# Patient Record
Sex: Female | Born: 1980 | Race: White | Hispanic: No | Marital: Single | Smoking: Never smoker
Health system: Southern US, Community
[De-identification: ages and names within clinical notes are randomized; demographics above are authoritative.]

## PROBLEM LIST (undated history)

## (undated) DIAGNOSIS — E119 Type 2 diabetes mellitus without complications: Secondary | ICD-10-CM

## (undated) DIAGNOSIS — N159 Renal tubulo-interstitial disease, unspecified: Secondary | ICD-10-CM

## (undated) HISTORY — PX: CHOLECYSTECTOMY: SHX55

## (undated) HISTORY — DX: Type 2 diabetes mellitus without complications: E11.9

## (undated) HISTORY — DX: Renal tubulo-interstitial disease, unspecified: N15.9

---

## 2004-04-13 ENCOUNTER — Emergency Department: Payer: Self-pay | Admitting: Emergency Medicine

## 2004-06-30 ENCOUNTER — Emergency Department: Payer: Self-pay | Admitting: Emergency Medicine

## 2004-08-16 ENCOUNTER — Emergency Department: Payer: Self-pay | Admitting: Emergency Medicine

## 2004-09-04 ENCOUNTER — Emergency Department: Payer: Self-pay | Admitting: Emergency Medicine

## 2004-09-28 ENCOUNTER — Inpatient Hospital Stay: Payer: Self-pay | Admitting: Internal Medicine

## 2004-10-02 ENCOUNTER — Ambulatory Visit: Payer: Self-pay | Admitting: Internal Medicine

## 2004-12-24 ENCOUNTER — Emergency Department: Payer: Self-pay | Admitting: Emergency Medicine

## 2005-07-05 ENCOUNTER — Emergency Department: Payer: Self-pay | Admitting: General Practice

## 2005-09-27 ENCOUNTER — Emergency Department: Payer: Self-pay | Admitting: Emergency Medicine

## 2006-04-17 ENCOUNTER — Emergency Department: Payer: Self-pay | Admitting: Internal Medicine

## 2006-08-26 ENCOUNTER — Emergency Department: Payer: Self-pay

## 2006-08-28 ENCOUNTER — Emergency Department: Payer: Self-pay | Admitting: Emergency Medicine

## 2006-10-06 ENCOUNTER — Emergency Department: Payer: Self-pay | Admitting: Internal Medicine

## 2006-10-25 ENCOUNTER — Emergency Department: Payer: Self-pay | Admitting: Emergency Medicine

## 2006-10-27 ENCOUNTER — Emergency Department: Payer: Self-pay | Admitting: Unknown Physician Specialty

## 2007-03-21 ENCOUNTER — Emergency Department: Payer: Self-pay | Admitting: Internal Medicine

## 2007-03-22 ENCOUNTER — Emergency Department: Payer: Self-pay | Admitting: Emergency Medicine

## 2009-11-21 ENCOUNTER — Emergency Department: Payer: Self-pay | Admitting: Emergency Medicine

## 2009-11-28 ENCOUNTER — Ambulatory Visit: Payer: Self-pay | Admitting: Surgery

## 2009-12-02 LAB — PATHOLOGY REPORT

## 2010-04-23 ENCOUNTER — Emergency Department: Payer: Self-pay | Admitting: Internal Medicine

## 2010-07-21 ENCOUNTER — Emergency Department: Payer: Self-pay | Admitting: Emergency Medicine

## 2011-07-21 ENCOUNTER — Emergency Department: Payer: Self-pay | Admitting: Unknown Physician Specialty

## 2011-07-22 LAB — URINALYSIS, COMPLETE
Bilirubin,UR: NEGATIVE
Glucose,UR: >=500 mg/dL (ref 0–75)
Ph: 5 (ref 4.5–8.0)
Protein: NEGATIVE
RBC,UR: 15 /HPF (ref 0–5)
Specific Gravity: 1.032 (ref 1.003–1.030)
Squamous Epithelial: 2
WBC UR: 235 /HPF (ref 0–5)

## 2011-09-27 ENCOUNTER — Emergency Department: Payer: Self-pay | Admitting: Emergency Medicine

## 2011-09-27 LAB — URINALYSIS, COMPLETE
Bilirubin,UR: NEGATIVE
Glucose,UR: NEGATIVE mg/dL (ref 0–75)
Ketone: NEGATIVE
Nitrite: NEGATIVE
Ph: 5 (ref 4.5–8.0)
RBC,UR: 11 /HPF (ref 0–5)
Specific Gravity: 1.024 (ref 1.003–1.030)

## 2011-09-27 LAB — CBC
HCT: 41.5 % (ref 35.0–47.0)
MCH: 28.8 pg (ref 26.0–34.0)
MCHC: 33.9 g/dL (ref 32.0–36.0)
Platelet: 280 10*3/uL (ref 150–440)
RDW: 12.9 % (ref 11.5–14.5)
WBC: 10.8 10*3/uL (ref 3.6–11.0)

## 2011-09-27 LAB — COMPREHENSIVE METABOLIC PANEL
Alkaline Phosphatase: 89 U/L (ref 50–136)
BUN: 15 mg/dL (ref 7–18)
Bilirubin,Total: 0.8 mg/dL (ref 0.2–1.0)
Chloride: 102 mmol/L (ref 98–107)
EGFR (African American): >60
Glucose: 172 mg/dL — ABNORMAL HIGH (ref 65–99)
SGOT(AST): 31 U/L (ref 15–37)
SGPT (ALT): 39 U/L
Total Protein: 9.1 g/dL — ABNORMAL HIGH (ref 6.4–8.2)

## 2011-09-27 LAB — LIPASE, BLOOD: Lipase: 70 U/L — ABNORMAL LOW (ref 73–393)

## 2012-02-22 ENCOUNTER — Emergency Department: Payer: Self-pay | Admitting: Emergency Medicine

## 2013-03-01 ENCOUNTER — Emergency Department: Payer: Self-pay | Admitting: Emergency Medicine

## 2013-05-14 ENCOUNTER — Emergency Department: Payer: Self-pay | Admitting: Emergency Medicine

## 2013-05-14 LAB — CBC WITH DIFFERENTIAL/PLATELET
BASOS PCT: 0.3 %
Basophil #: 0 10*3/uL (ref 0.0–0.1)
EOS ABS: 0.1 10*3/uL (ref 0.0–0.7)
Eosinophil %: 1.4 %
HCT: 40 % (ref 35.0–47.0)
HGB: 13.4 g/dL (ref 12.0–16.0)
Lymphocyte #: 3.8 10*3/uL — ABNORMAL HIGH (ref 1.0–3.6)
Lymphocyte %: 40.6 %
MCH: 27.5 pg (ref 26.0–34.0)
MCHC: 33.4 g/dL (ref 32.0–36.0)
MCV: 83 fL (ref 80–100)
Monocyte #: 0.4 x10 3/mm (ref 0.2–0.9)
Monocyte %: 4.2 %
NEUTROS ABS: 5.1 10*3/uL (ref 1.4–6.5)
Neutrophil %: 53.5 %
Platelet: 259 10*3/uL (ref 150–440)
RBC: 4.85 10*6/uL (ref 3.80–5.20)
RDW: 13.7 % (ref 11.5–14.5)
WBC: 9.4 10*3/uL (ref 3.6–11.0)

## 2013-05-14 LAB — COMPREHENSIVE METABOLIC PANEL
ANION GAP: 4 — AB (ref 7–16)
Albumin: 3.5 g/dL (ref 3.4–5.0)
Alkaline Phosphatase: 109 U/L
BUN: 7 mg/dL (ref 7–18)
Bilirubin,Total: 1 mg/dL (ref 0.2–1.0)
Calcium, Total: 9.1 mg/dL (ref 8.5–10.1)
Chloride: 104 mmol/L (ref 98–107)
Co2: 27 mmol/L (ref 21–32)
Creatinine: 0.77 mg/dL (ref 0.60–1.30)
EGFR (African American): >60
Glucose: 233 mg/dL — ABNORMAL HIGH (ref 65–99)
OSMOLALITY: 276 (ref 275–301)
POTASSIUM: 3.7 mmol/L (ref 3.5–5.1)
SGOT(AST): 81 U/L — ABNORMAL HIGH (ref 15–37)
SGPT (ALT): 64 U/L (ref 12–78)
Sodium: 135 mmol/L — ABNORMAL LOW (ref 136–145)
Total Protein: 9.3 g/dL — ABNORMAL HIGH (ref 6.4–8.2)

## 2013-05-14 LAB — URINALYSIS, COMPLETE
Bacteria: NONE SEEN
Bilirubin,UR: NEGATIVE
Ketone: NEGATIVE
NITRITE: NEGATIVE
Ph: 5 (ref 4.5–8.0)
RBC,UR: 260 /HPF (ref 0–5)
Specific Gravity: 1.023 (ref 1.003–1.030)
WBC UR: 24 /HPF (ref 0–5)

## 2013-05-14 LAB — LIPASE, BLOOD: Lipase: 73 U/L (ref 73–393)

## 2013-05-14 LAB — PREGNANCY, URINE: Pregnancy Test, Urine: NEGATIVE m[IU]/mL

## 2013-08-13 ENCOUNTER — Inpatient Hospital Stay: Payer: Self-pay | Admitting: Student

## 2013-08-13 LAB — CBC WITH DIFFERENTIAL/PLATELET
BASOS ABS: 0 10*3/uL (ref 0.0–0.1)
BASOS PCT: 0.1 %
EOS PCT: 0 %
Eosinophil #: 0 10*3/uL (ref 0.0–0.7)
HCT: 38.9 % (ref 35.0–47.0)
HGB: 12.9 g/dL (ref 12.0–16.0)
LYMPHS PCT: 8.9 %
Lymphocyte #: 1.3 10*3/uL (ref 1.0–3.6)
MCH: 26.8 pg (ref 26.0–34.0)
MCHC: 33.1 g/dL (ref 32.0–36.0)
MCV: 81 fL (ref 80–100)
MONO ABS: 0.7 x10 3/mm (ref 0.2–0.9)
Monocyte %: 4.9 %
NEUTROS ABS: 12.3 10*3/uL — AB (ref 1.4–6.5)
Neutrophil %: 86.1 %
Platelet: 180 10*3/uL (ref 150–440)
RBC: 4.79 10*6/uL (ref 3.80–5.20)
RDW: 14.8 % — ABNORMAL HIGH (ref 11.5–14.5)
WBC: 14.2 10*3/uL — ABNORMAL HIGH (ref 3.6–11.0)

## 2013-08-13 LAB — COMPREHENSIVE METABOLIC PANEL
ALK PHOS: 91 U/L
ALT: 40 U/L (ref 12–78)
AST: 32 U/L (ref 15–37)
Albumin: 3.2 g/dL — ABNORMAL LOW (ref 3.4–5.0)
Anion Gap: 8 (ref 7–16)
BILIRUBIN TOTAL: 1.1 mg/dL — AB (ref 0.2–1.0)
BUN: 7 mg/dL (ref 7–18)
Calcium, Total: 9 mg/dL (ref 8.5–10.1)
Chloride: 102 mmol/L (ref 98–107)
Co2: 24 mmol/L (ref 21–32)
Creatinine: 0.77 mg/dL (ref 0.60–1.30)
EGFR (Non-African Amer.): >60
Glucose: 197 mg/dL — ABNORMAL HIGH (ref 65–99)
Osmolality: 272 (ref 275–301)
POTASSIUM: 3.8 mmol/L (ref 3.5–5.1)
SODIUM: 134 mmol/L — AB (ref 136–145)
TOTAL PROTEIN: 8.9 g/dL — AB (ref 6.4–8.2)

## 2013-08-13 LAB — URINALYSIS, COMPLETE
BACTERIA: NONE SEEN
Bilirubin,UR: NEGATIVE
Glucose,UR: NEGATIVE mg/dL (ref 0–75)
Nitrite: POSITIVE
PH: 6 (ref 4.5–8.0)
Protein: 30
RBC,UR: NONE SEEN /HPF (ref 0–5)
Specific Gravity: 1.016 (ref 1.003–1.030)
WBC UR: 113 /HPF (ref 0–5)

## 2013-08-15 LAB — CREATININE, SERUM: CREATININE: 0.54 mg/dL — AB (ref 0.60–1.30)

## 2013-08-15 LAB — URINE CULTURE

## 2013-08-15 LAB — VANCOMYCIN, TROUGH: Vancomycin, Trough: 6 ug/mL — ABNORMAL LOW (ref 10–20)

## 2013-08-16 LAB — CBC WITH DIFFERENTIAL/PLATELET
BASOS PCT: 0.2 %
Basophil #: 0 10*3/uL (ref 0.0–0.1)
EOS ABS: 0.1 10*3/uL (ref 0.0–0.7)
Eosinophil %: 1.4 %
HCT: 33.4 % — ABNORMAL LOW (ref 35.0–47.0)
HGB: 11 g/dL — ABNORMAL LOW (ref 12.0–16.0)
LYMPHS PCT: 34.8 %
Lymphocyte #: 2.7 10*3/uL (ref 1.0–3.6)
MCH: 26.9 pg (ref 26.0–34.0)
MCHC: 32.9 g/dL (ref 32.0–36.0)
MCV: 82 fL (ref 80–100)
Monocyte #: 0.5 x10 3/mm (ref 0.2–0.9)
Monocyte %: 6 %
NEUTROS PCT: 57.6 %
Neutrophil #: 4.5 10*3/uL (ref 1.4–6.5)
Platelet: 184 10*3/uL (ref 150–440)
RBC: 4.09 10*6/uL (ref 3.80–5.20)
RDW: 14.4 % (ref 11.5–14.5)
WBC: 7.8 10*3/uL (ref 3.6–11.0)

## 2013-08-16 LAB — HEMOGLOBIN A1C: Hemoglobin A1C: 10.2 % — ABNORMAL HIGH (ref 4.2–6.3)

## 2013-08-16 LAB — BASIC METABOLIC PANEL
Anion Gap: 3 — ABNORMAL LOW (ref 7–16)
BUN: 5 mg/dL — ABNORMAL LOW (ref 7–18)
CO2: 26 mmol/L (ref 21–32)
Calcium, Total: 8.3 mg/dL — ABNORMAL LOW (ref 8.5–10.1)
Chloride: 107 mmol/L (ref 98–107)
Creatinine: 0.61 mg/dL (ref 0.60–1.30)
GLUCOSE: 188 mg/dL — AB (ref 65–99)
Osmolality: 274 (ref 275–301)
Potassium: 3.6 mmol/L (ref 3.5–5.1)
Sodium: 136 mmol/L (ref 136–145)

## 2013-08-17 LAB — WOUND AEROBIC CULTURE

## 2013-08-17 LAB — CLOSTRIDIUM DIFFICILE(ARMC)

## 2013-08-18 LAB — CULTURE, BLOOD (SINGLE)

## 2013-08-19 LAB — CULTURE, BLOOD (SINGLE)

## 2013-10-23 ENCOUNTER — Emergency Department: Payer: Self-pay | Admitting: Emergency Medicine

## 2013-10-24 LAB — CBC
HCT: 37.3 % (ref 35.0–47.0)
HGB: 12.6 g/dL (ref 12.0–16.0)
MCH: 27.9 pg (ref 26.0–34.0)
MCHC: 33.9 g/dL (ref 32.0–36.0)
MCV: 82 fL (ref 80–100)
Platelet: 269 10*3/uL (ref 150–440)
RBC: 4.53 10*6/uL (ref 3.80–5.20)
RDW: 14.1 % (ref 11.5–14.5)
WBC: 11 10*3/uL (ref 3.6–11.0)

## 2013-11-27 DIAGNOSIS — E119 Type 2 diabetes mellitus without complications: Secondary | ICD-10-CM | POA: Insufficient documentation

## 2014-03-06 IMAGING — CR LEFT MIDDLE FINGER 2+V
1 series · 3 of 3 positions shown · non-contrast
Comparison: none

REASON FOR EXAM: finger infection eval deep space infection or
osteomyelitis
COMMENTS:

[Series 1: x finger pa left · 0.14mm/px · 3 of 3 slices shown]
[im 1/3]
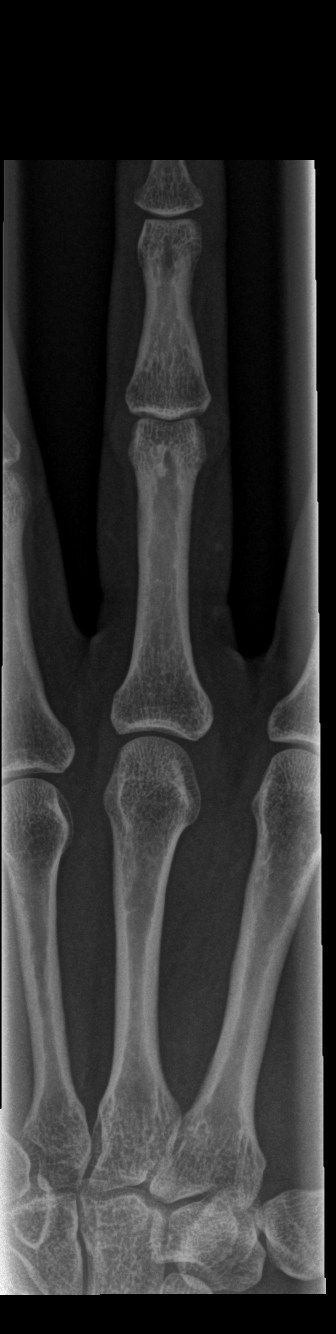
[im 2/3]
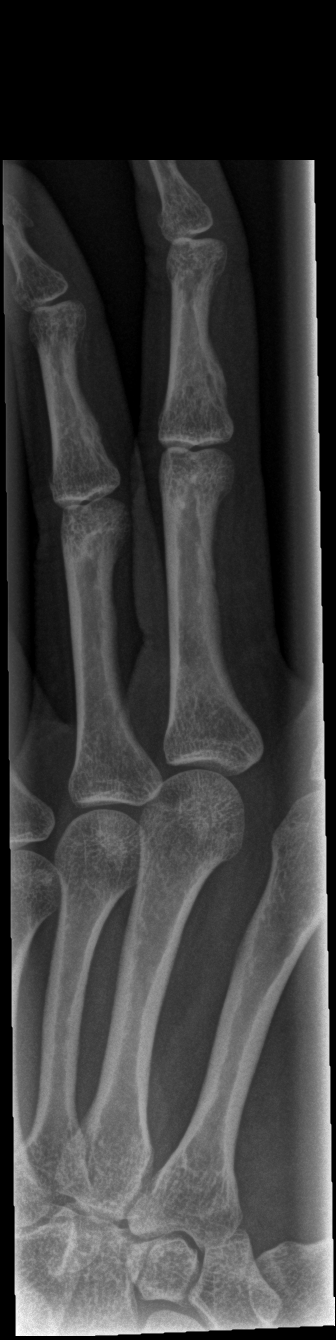
[im 3/3]
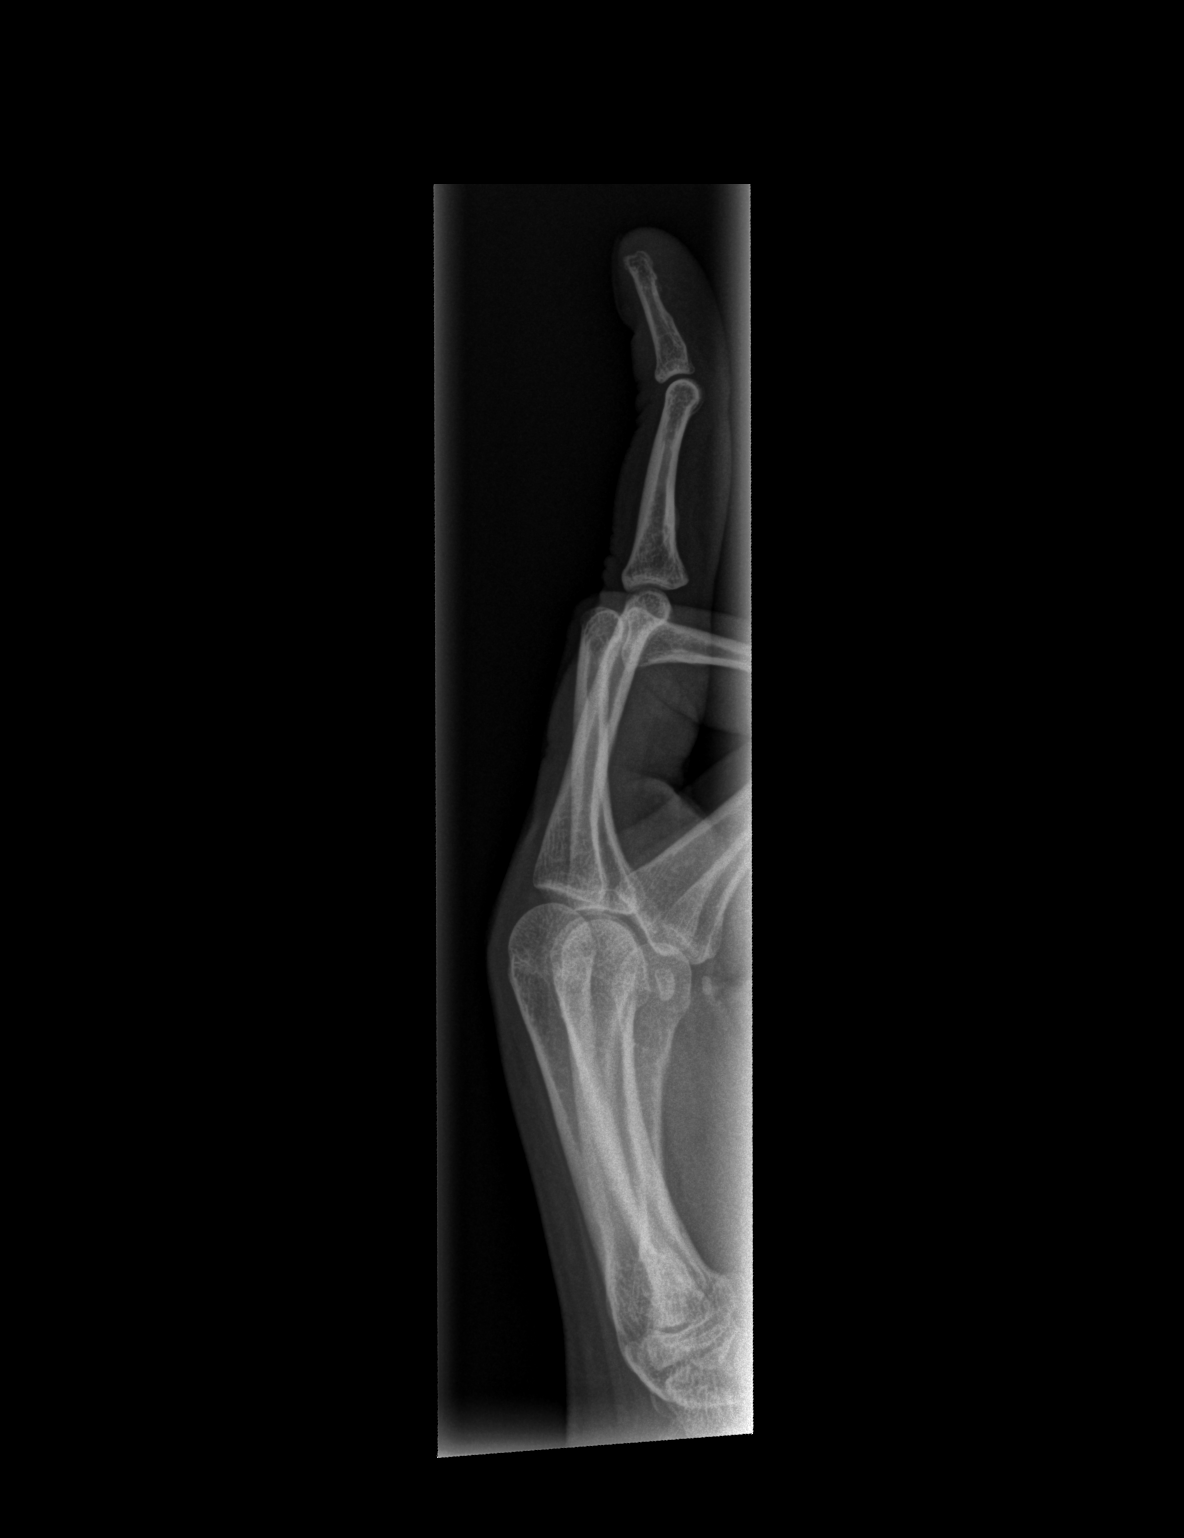

[3 of 3 positions shown; findings below may reference images not displayed]

PROCEDURE:     DXR - DXR FINGER MID 3RD DIGIT LT HAND  - February 22, 2012  [DATE]

RESULT:     Three views of the third or long finger reveal the bones to be
adequately mineralized. The site of the patient's infection is not known to
me. I do not see gas in the soft tissues. The joint spaces are preserved.
There is no periosteal reaction or lytic or blastic lesion.
IMPRESSION: No bony abnormality of the finger is demonstrated. No soft
tissue gas is evident.

[REDACTED]

## 2014-03-26 LAB — HEPATIC FUNCTION PANEL
ALT: 41 U/L — AB (ref 7–35)
AST: 35 U/L (ref 13–35)
Alkaline Phosphatase: 71 U/L (ref 25–125)
BILIRUBIN DIRECT: 0.16 mg/dL (ref 0.01–0.4)
BILIRUBIN, TOTAL: 0.6 mg/dL

## 2014-03-26 LAB — HEMOGLOBIN A1C: HEMOGLOBIN A1C: 7.6

## 2014-03-26 LAB — BASIC METABOLIC PANEL
BUN: 10 mg/dL (ref 4–21)
Creatinine: 0.5 mg/dL (ref 0.5–1.1)
Glucose: 130 mg/dL
POTASSIUM: 4.8 mmol/L (ref 3.4–5.3)
SODIUM: 138 mmol/L (ref 137–147)

## 2014-03-26 LAB — TSH: TSH: 1.99 u[IU]/mL (ref 0.41–5.90)

## 2014-03-26 LAB — CBC AND DIFFERENTIAL
HEMATOCRIT: 36 % (ref 36–46)
HEMOGLOBIN: 12.6 g/dL (ref 12.0–16.0)
Neutrophils Absolute: 6 /uL
Platelets: 285 10*3/uL (ref 150–399)
WBC: 10.2 10^3/mL

## 2014-03-26 LAB — LIPID PANEL
CHOLESTEROL: 183 mg/dL (ref 0–200)
HDL: 26 mg/dL — AB (ref 35–70)
Triglycerides: 402 mg/dL — AB (ref 40–160)

## 2014-07-13 NOTE — Consult Note (Signed)
PATIENT NAME:  Felicia DykesCORLISS, Katora L MR#:  [id number] DATE OF BIRTH:  04/20/1980  DATE OF CONSULTATION:  08/15/2013  REFERRING PHYSICIAN:   CONSULTING PHYSICIAN:  Rhona RaiderMatthew G. Denika Krone, DPM  The patient was admitted on May 25th with a combination of UTI and cellulitis infection into the right great toe. She is a type 1 diabetic with history of poor control. She was admitted to the ER with temperature of 101.2 and elevated white count. She has been on vancomycin and ceftriaxone since 2 days ago. Cultures have shown strep and staph growth, but no sensitivities as of yet.   SOCIAL HISTORY: Includes occasional EtOH. Denies recreational drug or tobacco use.   FAMILY HISTORY: Includes diabetes and COPD.  HOME MEDICATIONS:  Include Glucophage 500 mg by mouth twice a day.   White count on May 25th was 14.2 and serum glucose was 197. BUN and creatinine were within normal limits.   PHYSICAL EXAMINATION:  VITAL SIGNS: Currently, temperature 98.4, pulse 92, respirations 18, blood pressure 111/76, pulse ox percentage is 99%.  LOWER EXTREMITIES: Vascular DP pulses are +2/4 bilaterally.  NEUROLOGIC: The patient has extensive neuropathy to both lower extremities with minimal feeling to either foot.  DERMATOLOGIC: The patient has significant hyperkeratotic buildup on the IPJ of the right hallux plantarly. There are indications that this is also a neuropathic ulceration with cellulitis and lymphangitis extending from this area. Debridement of the area today shows an ulcerative change plantarly and medially on that IP joints. Total size is about 2 cm x 1.5 cm. It does not appear to have a deeper penetration down to bone at this point, or any sinus tracking.   X-rays reviewed. No specific evidence of osteomyelitis or bone demineralization at that region at this timeframe.   CLINICAL IMPRESSION:  Diabetic ulceration with cellulitis, right great toe.   TREATMENT PLAN:  I am going to do an excisional debridement with a  15 blade to remove all thickened hyperkeratotic and devitalized tissue from the wound. This included the periphery, as well as centrally. This was followed with a wet-to-dry saline dressing, and I will move her into an OrthoWedge shoe with daily dressing changes and Bactroban, and we will continue to follow her for the next couple of days until we get cultures back and see if we can switch her to an oral antibiotic.    ____________________________ Rhona RaiderMatthew G. Rahm Minix, DPM mgt:dmm D: 08/15/2013 18:17:59 ET T: 08/15/2013 21:12:36 ET JOB#: [id number]  cc: Rhona RaiderMatthew G. Aiman Sonn, DPM, <Dictator> Epimenio SarinMATTHEW G Raynah Gomes MD ELECTRONICALLY SIGNED 08/20/2013 15:02

## 2014-07-13 NOTE — H&P (Signed)
PATIENT NAME:  Felicia Brewer, Felicia Brewer MR#:  [id number] DATE OF BIRTH:  10-Nov-1980  DATE OF ADMISSION:  08/13/2013  REFERRING PHYSICIAN: Dr. Margarita GrizzleWoodruff.   PRIMARY CARE PHYSICIAN: None.   CHIEF COMPLAINT: Not feeling well.   HISTORY OF PRESENT ILLNESS: A 34 year old Caucasian female with past medical history of diabetes, presenting with generalized malaise. Describes one day duration of fevers, chills, nausea, vomiting, nonbloody, nonbilious emesis with associated headache. Describes headache as frontal, mainly  aching in quality, 3 to 4 out of 10 in intensity, worse with bright lights, loud noises. No relieving factors. With the above symptoms, decided to present to the hospital for further work-up and evaluation. He also took temperature at home; maximum was 101.2 degrees Fahrenheit.   REVIEW OF SYSTEMS:   CONSTITUTIONAL: Positive for fevers, chills, fatigue.  EYES: Denies blurred vision, double vision, eye pain.  EARS, NOSE, THROAT: Denies tinnitus, ear pain, hearing loss.  RESPIRATORY: Denies cough, wheeze, shortness of breath.  CARDIOVASCULAR: Denies chest pain, palpitations, edema. GASTROINTESTINAL: Denies nausea, vomiting, diarrhea or abdominal pain.  GENITOURINARY: Denies dysuria, hematuria or change in urinary frequency.  ENDOCRINE: Denies nocturia or thyroid problems.  HEMATOLOGIC AND LYMPHATIC: Denies easy bruising, bleeding.  SKIN: Denies rash or lesion.  MUSCULOSKELETAL: Denies pain in neck, back, shoulder, knees, hips or arthritic symptoms.  NEUROLOGIC: Positive for numbness in both feet bilaterally, which is chronic. Denies any weakness.  PSYCHIATRIC: Denies anxiety or depressive symptoms.  Otherwise, full review of systems performed by me is negative.   PAST MEDICAL HISTORY: Diabetes.   SOCIAL HISTORY: Occasional alcohol use. Denies any drug use or tobacco use.   FAMILY HISTORY: Positive for diabetes and chronic obstructive pulmonary disease.   ALLERGIES: No known drug  allergies.   HOME MEDICATIONS: Include Glucophage 500 mg by mouth twice daily.   PHYSICAL EXAMINATION: VITAL SIGNS: Temperature 99.2, heart rate 118, respirations 18, blood pressure 121/81, saturating 98% on room air. Weight 86.2 kg, BMI 32.6.  GENERAL: Well-nourished, well-developed, Caucasian female, appearing in no acute distress.  HEAD: Normocephalic, atraumatic.  EYES: Pupils equal, round, reactive to light. Extraocular movements intact. No scleral icterus. MOUTH: Moist mucous membranes. Dentition intact. No exudates noted. EARS, NOSE, AND THROAT: Clear, without exudates,  lesions. NECK: Supple. No thyromegaly. No nodules. No JVD.  PULMONARY: Clear to auscultation bilaterally without wheezes, rales, or rhonchi. No use of accessory muscles. Good respiratory effort. Chest nontender to palpation.  CARDIOVASCULAR: S1, S2, tachycardic. No murmurs, rubs or gallops. No edema. Pedal pulses 2+ bilaterally. GASTROINTESTINAL: Soft. Tenderness to palpation over suprapubic region, without rebound or guarding. Nondistended. No masses. Positive bowel sounds. No hepatosplenomegaly. Bilateral CVA tenderness is present.  MUSCULOSKELETAL: No swelling, clubbing, or edema. Full range of motion. Right great toe with erythema and streaking up to mid shin.  NEUROLOGIC: Cranial nerves II through XII intact. No gross focal neurologic deficits. Reflexes intact. Diminished sensation to light touch over the plantar surfaces of both feet.  SKIN: No ulcerations, lesions, rashes, or cyanosis, other than as described above over right great toe, with erythema and streaking to mid shin, which is warm to touch. Skin warm, dry. Turgor intact.  PSYCHIATRIC: Mood and affect within normal limits. The patient is awake, alert, oriented x3. Insight and judgment intact.   LABORATORY DATA: Urine pregnancy test negative. Sodium 134, potassium 3.8, chloride 102, bicarbonate 24, BUN 7, creatinine 0.77, glucose 197. LFTs: Total protein  8.9, albumin 3.2, bilirubin 1.1, alkaline phosphatase 91, AST 32, ALT 40. WBC 14.2, hemoglobin 12.9, and platelets  of 180. Urinalysis: Leukocyte esterase 3+, nitrite positive, protein 30, WBCs 113, RBCs 0, epithelial cells 5. Foot x-ray performed, revealing slight cortical irregularity without osseous erosion, not suggestive of osteomyelitis.   ASSESSMENT AND PLAN: A 34 year old Caucasian female with history of diabetes, presenting with generalized malaise.   1.  Sepsis. Meets sepsis criteria by heart rate and leukocytosis, likely urinary source, though, of note, she does have cellulitis of the right great toe. Panculture including blood, urine and wound cultures were sent from the Emergency Department after the Emergency Room physician performed an I and D which drained serous fluid of the right great toe. Antibiotic coverage with ceftriaxone and vancomycin. Will follow culture data. Decrease antibiotics when data returns. IV fluid hydration. Keep mean arterial pressure greater than 65.  2.  Diabetes. Hold oral agents. Add insulin sliding scale with every 6 hour Accu-Cheks.  3.  Hyponatremia. IV fluid hydration with normal saline. Follow sodium levels.  4.  Deep venous thrombosis prophylaxis with heparin subcutaneous.   CODE STATUS: The patient is full code.   TIME SPENT: 45 minutes.   ____________________________ Cletis Athens. Teonna Coonan, MD dkh:cg D: 08/14/2013 00:04:59 ET T: 08/14/2013 00:21:17 ET JOB#: [id number]  cc: Cletis Athens. Jezlyn Westerfield, MD, <Dictator> Harshaan Whang Synetta Shadow MD ELECTRONICALLY SIGNED 08/15/2013 3:25

## 2014-07-13 NOTE — Discharge Summary (Signed)
PATIENT NAME:  Felicia DykesCORLISS, Nyana L MR#:  [id number] DATE OF BIRTH:  18-Apr-1980  DATE OF ADMISSION:  08/13/2013 DATE OF DISCHARGE:  08/17/2013  CONSULTANTS: Dr. Orland Jarredroxler from podiatry.   PRIMARY CARE PHYSICIAN:  following at [facility].   CHIEF COMPLAINT:  Not feeling well.   DISCHARGE DIAGNOSES: 1.  Sepsis due to urinary tract infection and diabetic foot infection/abscess, status post incision and drainage by podiatry.  2.  Diabetes, uncontrolled.  3.  Hyponatremia.  4.  Diabetic neuropathy.  DISCHARGE MEDICATIONS:  Metformin 1000 mg 3 times a day, gabapentin 300 mg once a day, cephalexin 500 mg every 12 hours until 7th of June, mupirocin 2% topical ointment 1 application every 12 hours, Lantus 18 units once a day at bedtime.   DIET:  ADA diet.   ACTIVITY:  As tolerated.   Please follow with PCP at [facility] within 1 to 2 weeks. Please follow with podiatry next week.   DISPOSITION:  Home.   SIGNIFICANT LABORATORY DATA AND IMAGING: Initial BUN 7, creatinine 0.77, sodium 134, glucose of 197. Hemoglobin A1c of 10.2. Urine cultures showing E. coli. Initial white count of 14,000. Wound cultures from the foot showing heavy growth of Staph aureus and strep.  The Staph aureus was oxacillin sensitive. Blood cultures: No growth to date from May 25, as well as May 26. C. diff negative. Great toe foot x-ray showing slight cortical irregularity along the base of the first distal phalanx without definitive osseous erosions to suggest osteomyelitis.   HISTORY OF PRESENT ILLNESS AND HOSPITAL COURSE: For full details of H and P, please see the dictation on May 25 by Dr. Clint GuyHower, but briefly this is 34 year old long-time diabetic, who came in with general malaise with 1 day duration of fevers, chills, nausea  and vomiting, which was nonbloody. She came into the hospital. She had a temperature of 101.2 per H and P at home.  Here, she was tachycardic and had a heart rate of 118, as well as significant  leukocytosis. She did meet sepsis criteria per definition and therefore was admitted, pancultured and started on ceftriaxone and vancomycin. In the Emergency Room, the patient also underwent an I and D by the ER physician and drained serous fluid of the right great toe area, which appeared to have an infection in it, as well as dirty urine. She was also seen by Dr. Orland Jarredroxler on May 27, and the patient underwent a second I and D at bedside. Cultures from the urine have shown a sensitive E. coli and the wound cultures are showing a couple of organisms. At this point, she will be discharged on Keflex for the duration of her treatment of the urine and wounds are susceptible. She was followed by Dr. Orland Jarredroxler on the 28th and was notified to follow with him as an outpatient. In regards to her diabetes, her hemoglobin A1c was significantly elevated. She appears to be in poor compliance and did not know what her hemoglobin A1c was an outpatient, and did not even know what a hemoglobin A1c was. She also does not have a glucometer and does not check her sugars regularly. Here, she was given a glucometer, was discharged on long-acting insulin, in addition to her metformin, and was advised to follow with a physician for up titration of her diabetes or down titration as needed.   She did have significant diabetic neuropathy and was started on gabapentin. I suspect the neuropathy also contributed to her diabetic ulcer/abscess, in addition to  her uncontrolled diabetes. She had mild hyponatremia, which has resolved. At this point, she was discharged after C. diff stool culture was negative, as she did have some loose stools on the day of discharge.   TOTAL TIME SPENT: 35 minutes.   The patient is FULL CODE.   ____________________________ Krystal Eaton, MD sa:dmm D: 08/20/2013 12:11:07 ET T: 08/20/2013 13:19:56 ET JOB#: [id number]  cc: Krystal Eaton, MD, <Dictator> Rhona Raider. Troxler, DPM Krystal Eaton  MD ELECTRONICALLY SIGNED 08/30/2013 10:57

## 2014-07-13 NOTE — Consult Note (Signed)
Brief Consult Note: Diagnosis: diabetic ulcer right great toe with cellulitis.   Patient was seen by consultant.   Consult note dictated.   Recommend to proceed with surgery or procedure.   Orders entered.   Comments: At bedside, I performed an excisional debridement of the right toe ulcer. All devitalized and hyperkeratotic tissue was removed with a 10 blade.  I will order a wedge shoe and daily dressing changes.  Electronic Signatures: Epimenio Sarinroxler, Trapper Meech G (MD)  (Signed 27-May-15 18:21)  Authored: Brief Consult Note   Last Updated: 27-May-15 18:21 by Epimenio Sarinroxler, Therma Lasure G (MD)

## 2015-05-27 IMAGING — CR DG ABDOMEN 1V
1 series · 2 of 2 positions shown · non-contrast
Comparison: CT scan from 09/27/2011

CLINICAL DATA: Abdominal pain and constipation.

EXAM:
ABDOMEN - 1 VIEW

[Series 1: t abdomen supine · 0.14mm/px · 2 of 2 slices shown]
[im 1/2]
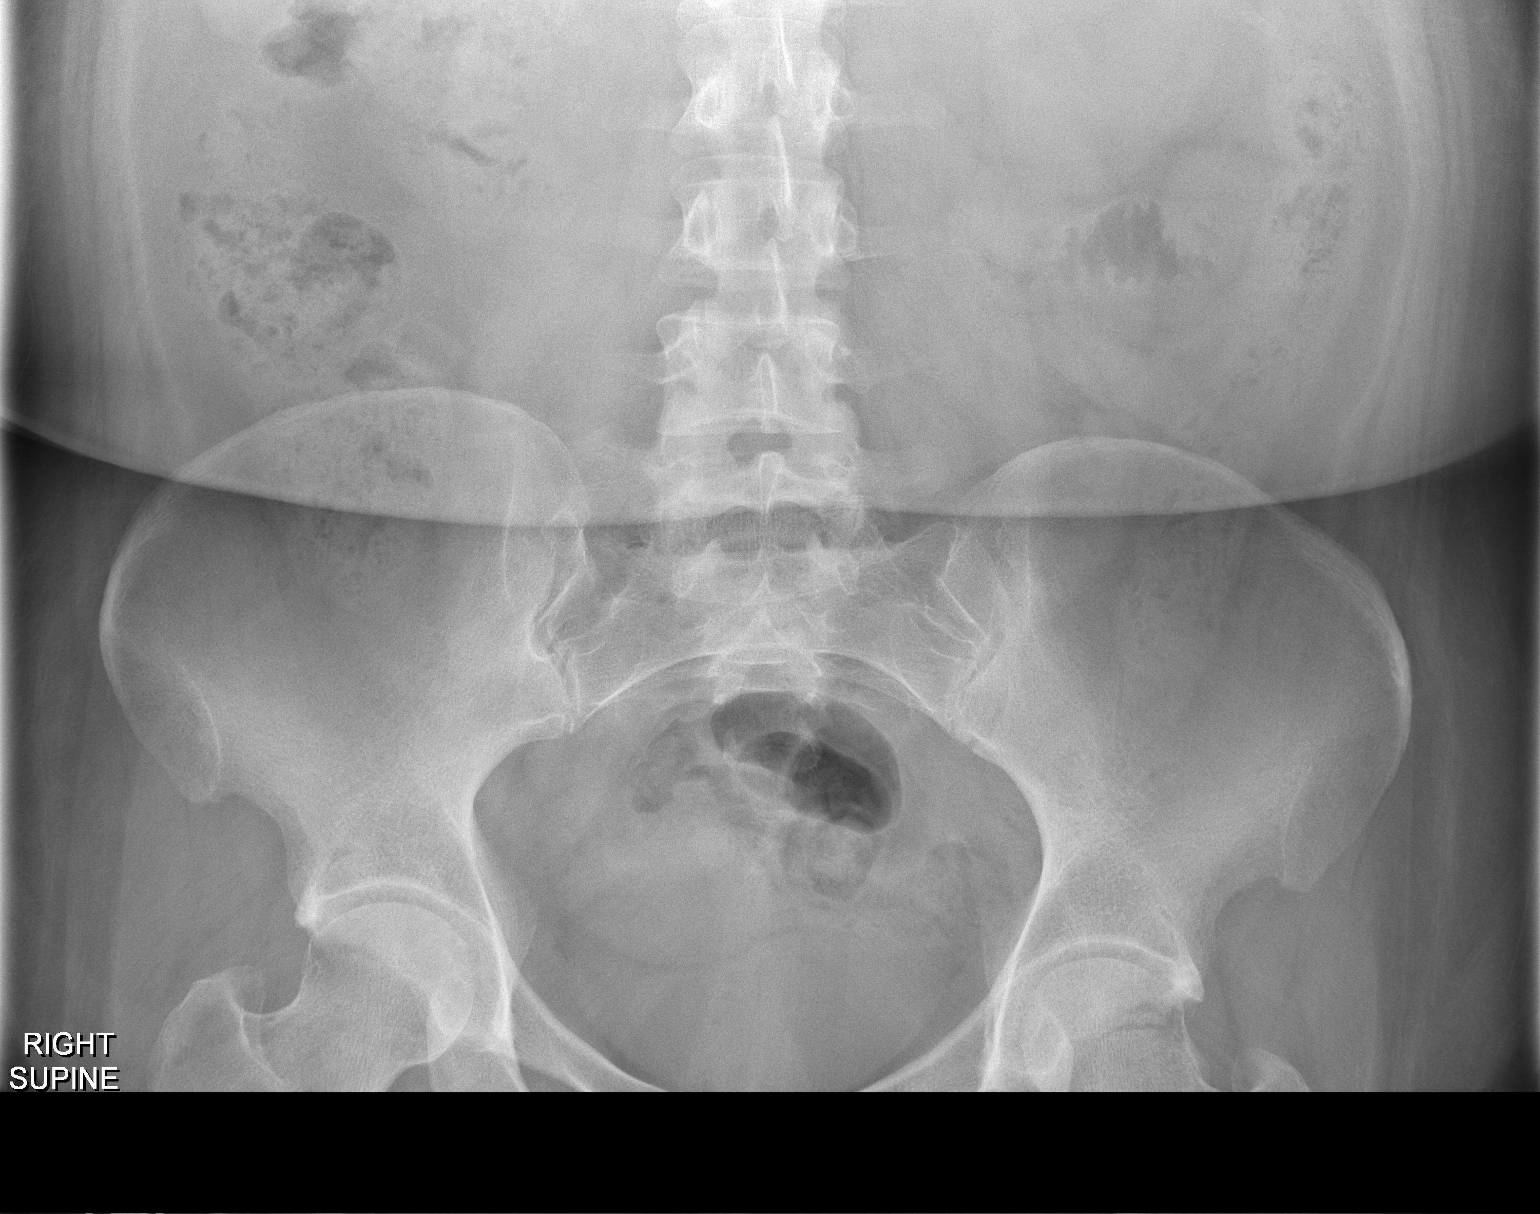
[im 2/2]
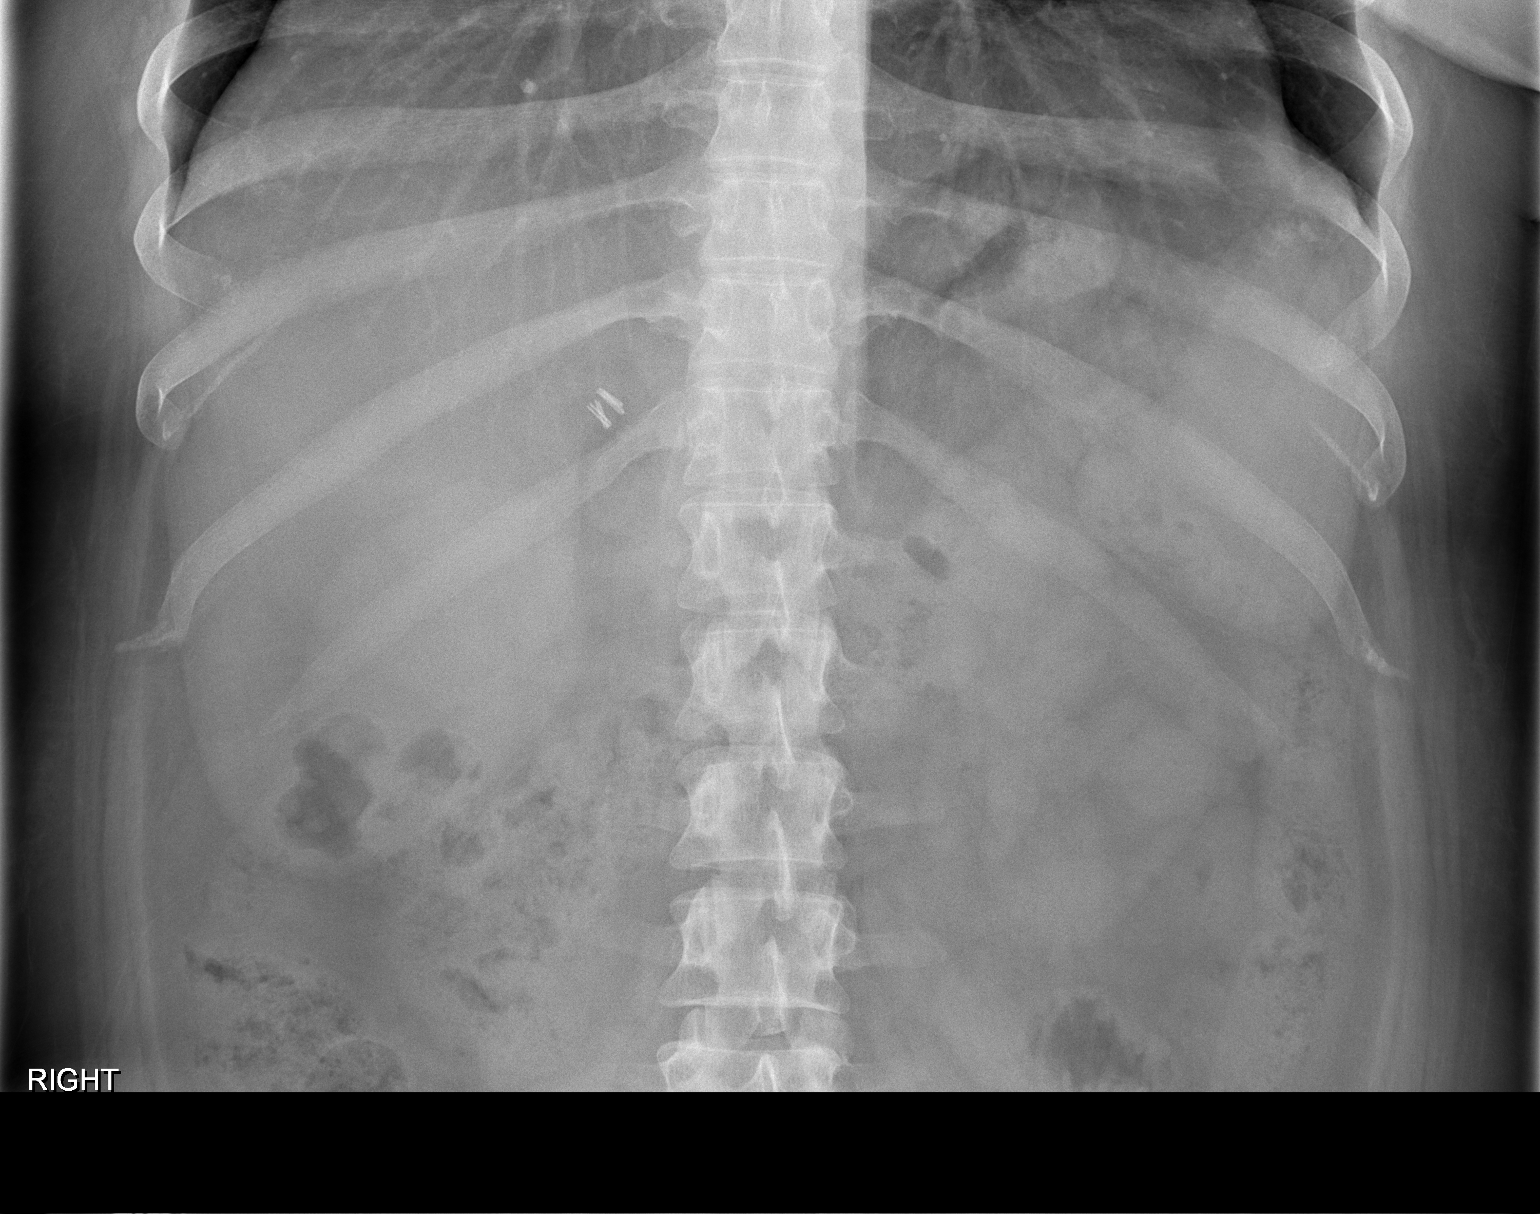

[2 of 2 positions shown; findings below may reference images not displayed]

FINDINGS: Supine view of the abdomen shows no gaseous bowel dilatation to
suggest obstruction. Surgical clips in the right upper quadrant
suggest prior cholecystectomy. Low to moderate stool volume
identified. No unexpected abdominal pelvic calcification. Mach
effect overlying the left femoral neck is secondary to the
acetabular rim.
IMPRESSION: Low to moderate stool volume. No radiographic features to suggest
clinical constipation.

## 2015-08-26 IMAGING — CR RIGHT GREAT TOE
1 series · 4 of 4 positions shown · non-contrast
Comparison: None.

CLINICAL DATA: Diabetic foot ulceration; assess for osteomyelitis.

EXAM:
RIGHT GREAT TOE

[Series 1: ap · 0.17mm/px · 4 of 4 slices shown]
[im 1/4]
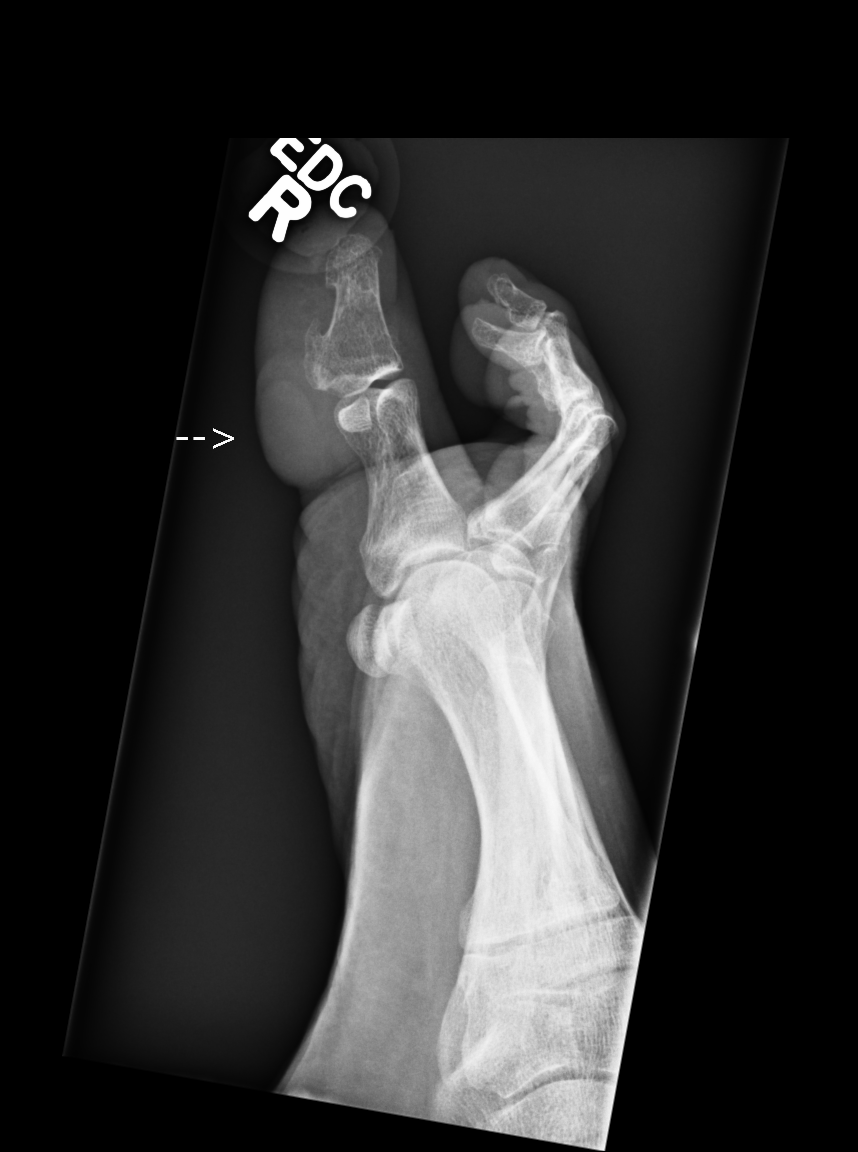
[im 2/4]
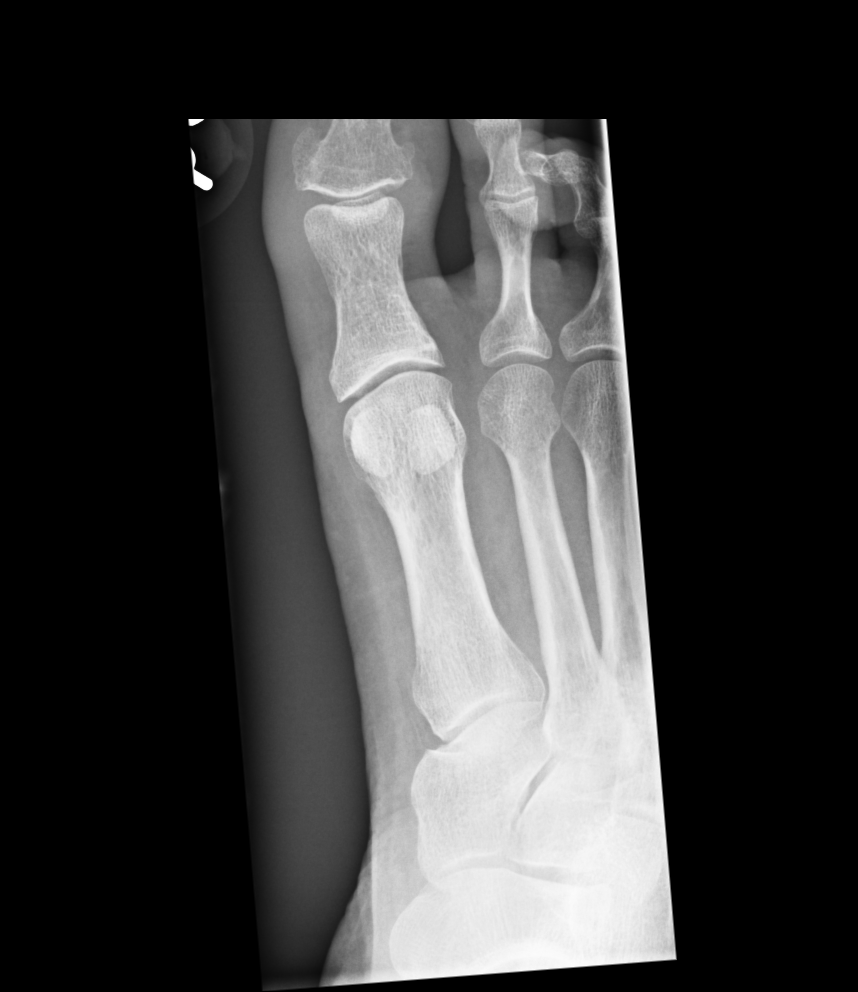
[im 3/4]
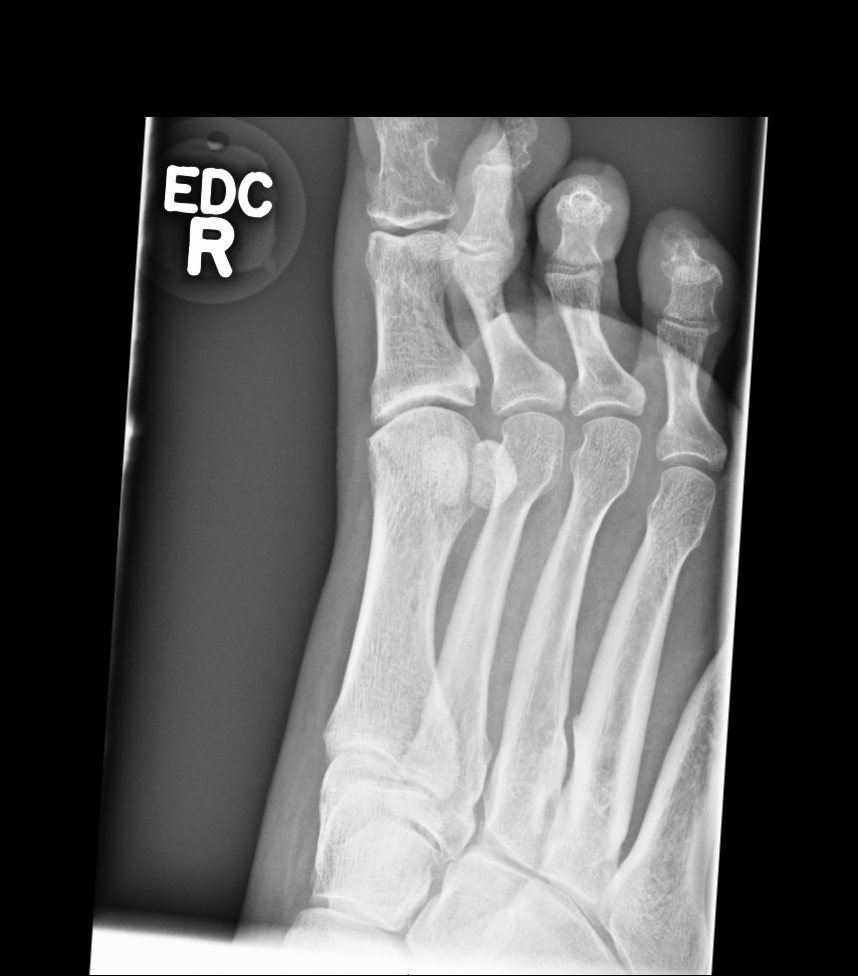
[im 4/4]
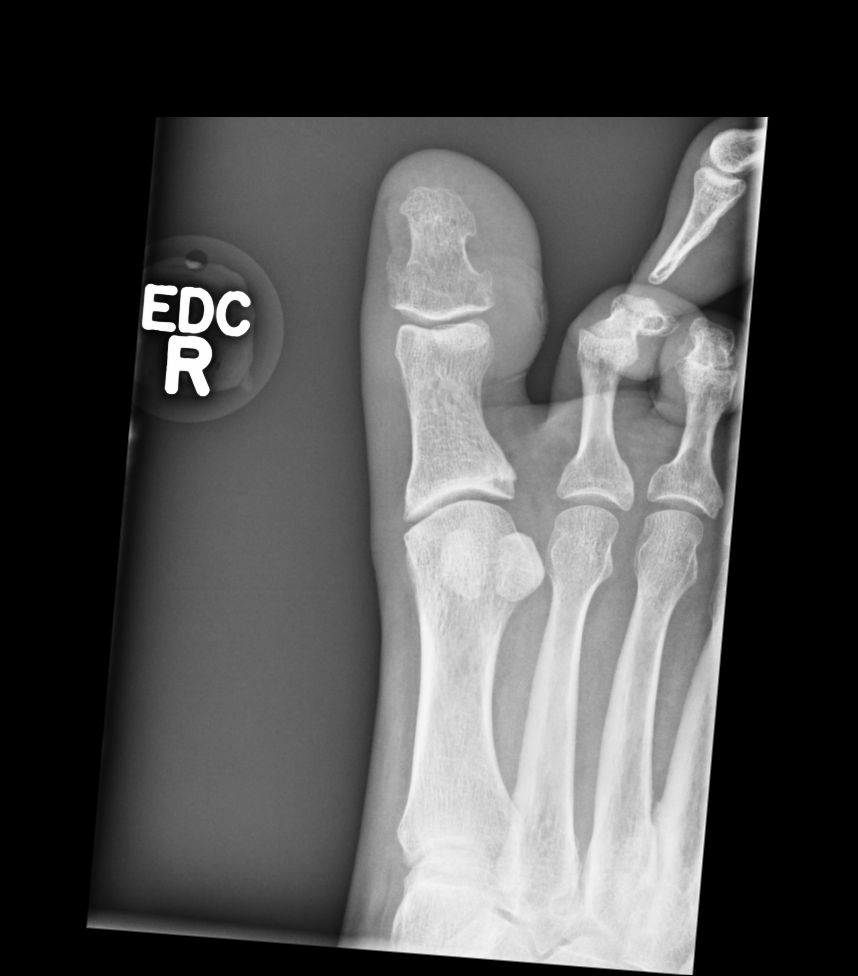

[4 of 4 positions shown; findings below may reference images not displayed]

FINDINGS: The known soft tissue ulceration is difficult to fully characterize,
though focal soft tissue swelling is noted along the plantar aspect
of the great toe. There is slight cortical irregularity along the
base of the first distal phalanx, without definite osseous erosion
to suggest osteomyelitis. However, MRI would be more sensitive for
osteomyelitis.

There is no evidence of fracture or dislocation. Visualized joint
spaces are preserved. No radiopaque foreign bodies are seen.
IMPRESSION: Slight cortical irregularity along the base of the first distal
phalanx, without definite osseous erosion to suggest osteomyelitis.
However, MRI would be more sensitive for osteomyelitis, as deemed
clinically appropriate. No evidence of fracture or dislocation.

## 2015-11-04 DIAGNOSIS — E119 Type 2 diabetes mellitus without complications: Secondary | ICD-10-CM

## 2015-11-04 DIAGNOSIS — Z794 Long term (current) use of insulin: Secondary | ICD-10-CM

## 2016-11-09 ENCOUNTER — Other Ambulatory Visit
Admission: RE | Admit: 2016-11-09 | Discharge: 2016-11-09 | Disposition: A | Discharge status: Home / Self Care | Payer: Medicaid Other | Source: Ambulatory Visit | Attending: Infectious Diseases | Admitting: Infectious Diseases

## 2016-11-09 DIAGNOSIS — H60512 Acute actinic otitis externa, left ear: Secondary | ICD-10-CM | POA: Insufficient documentation

## 2016-11-09 LAB — CBC WITH DIFFERENTIAL/PLATELET
Basophils Absolute: 0.1 10*3/uL (ref 0–0.1)
Basophils Relative: 1 %
EOS ABS: 0.2 10*3/uL (ref 0–0.7)
EOS PCT: 2 %
HCT: 34.4 % — ABNORMAL LOW (ref 35.0–47.0)
Hemoglobin: 11.8 g/dL — ABNORMAL LOW (ref 12.0–16.0)
LYMPHS ABS: 4.2 10*3/uL — AB (ref 1.0–3.6)
Lymphocytes Relative: 38 %
MCH: 29.6 pg (ref 26.0–34.0)
MCHC: 34.2 g/dL (ref 32.0–36.0)
MCV: 86.5 fL (ref 80.0–100.0)
MONOS PCT: 4 %
Monocytes Absolute: 0.5 10*3/uL (ref 0.2–0.9)
Neutro Abs: 6.2 10*3/uL (ref 1.4–6.5)
Neutrophils Relative %: 55 %
PLATELETS: 261 10*3/uL (ref 150–440)
RBC: 3.98 MIL/uL (ref 3.80–5.20)
RDW: 12.6 % (ref 11.5–14.5)
WBC: 11.1 10*3/uL — AB (ref 3.6–11.0)

## 2016-11-09 LAB — CREATININE, SERUM
CREATININE: 0.6 mg/dL (ref 0.44–1.00)
GFR calc non Af Amer: >60 mL/min (ref >60)

## 2016-11-09 LAB — VANCOMYCIN, TROUGH: VANCOMYCIN TR: 12 ug/mL — AB (ref 15–20)

## 2016-11-09 LAB — AST: AST: 27 U/L (ref 15–41)

## 2016-11-09 LAB — ALT: ALT: 20 U/L (ref 14–54)

## 2017-09-20 ENCOUNTER — Ambulatory Visit: Payer: Medicaid Other | Admitting: Physical Therapy

## 2017-09-27 ENCOUNTER — Encounter: Payer: Self-pay | Admitting: Physical Therapy

## 2017-10-04 ENCOUNTER — Encounter: Payer: Self-pay | Admitting: Physical Therapy
